# Patient Record
Sex: Male | Born: 1995 | Hispanic: No | Marital: Single | Smoking: Never smoker
Health system: Southern US, Community
[De-identification: ages and names within clinical notes are randomized; demographics above are authoritative.]

## PROBLEM LIST (undated history)

## (undated) DIAGNOSIS — J45909 Unspecified asthma, uncomplicated: Secondary | ICD-10-CM

## (undated) HISTORY — PX: NO PAST SURGERIES: SHX2092

---
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
## 2013-01-08 ENCOUNTER — Encounter: Payer: Self-pay | Admitting: Family Medicine

## 2013-01-08 ENCOUNTER — Ambulatory Visit (INDEPENDENT_AMBULATORY_CARE_PROVIDER_SITE_OTHER): Payer: Self-pay | Admitting: Family Medicine

## 2013-01-08 VITALS — BP 138/73 | HR 83 | Temp 99.4°F | Ht 73.5 in | Wt 298.0 lb

## 2013-01-08 DIAGNOSIS — J029 Acute pharyngitis, unspecified: Secondary | ICD-10-CM

## 2013-01-08 MED ORDER — BENZONATATE 100 MG PO CAPS: 100.0000 mg | ORAL_CAPSULE | Freq: Two times a day (BID) | ORAL | Status: AC | PRN

## 2013-01-08 MED ORDER — ACETAMINOPHEN-CODEINE #3 300-30 MG PO TABS: 1.0000 | ORAL_TABLET | ORAL | Status: AC | PRN

## 2013-01-08 MED ORDER — LIDOCAINE VISCOUS 2 % MT SOLN: 20.0000 mL | OROMUCOSAL | Status: DC | PRN

## 2013-01-08 NOTE — Patient Instructions (Signed)
Your sore throat is being caused by a virus. You can take either Tessalon or Tylenol with Codeine for your cough and viscous lidocaine for your sore throat.  Viral Pharyngitis Viral pharyngitis is a viral infection that produces redness, pain, and swelling (inflammation) of the throat. It can spread from person to person (contagious). CAUSES Viral pharyngitis is caused by inhaling a large amount of certain germs called viruses. Many different viruses cause viral pharyngitis. SYMPTOMS Symptoms of viral pharyngitis include:  Sore throat.  Tiredness.  Stuffy nose.  Low-grade fever.  Congestion.  Cough. TREATMENT Treatment includes rest, drinking plenty of fluids, and the use of over-the-counter medication (approved by your caregiver). HOME CARE INSTRUCTIONS   Drink enough fluids to keep your urine clear or pale yellow.  Eat soft, cold foods such as ice cream, frozen ice pops, or gelatin dessert.  Gargle with warm salt water (1 tsp salt per 1 qt of water).  If over age 33, throat lozenges may be used safely.  Only take over-the-counter or prescription medicines for pain, discomfort, or fever as directed by your caregiver. Do not take aspirin. To help prevent spreading viral pharyngitis to others, avoid:  Mouth-to-mouth contact with others.  Sharing utensils for eating and drinking.  Coughing around others. SEEK MEDICAL CARE IF:   You are better in a few days, then become worse.  You have a fever or pain not helped by pain medicines.  There are any other changes that concern you. Document Released: 06/09/2005 Document Revised: 11/22/2011 Document Reviewed: 11/05/2010 [facility] Patient Information 2013 [place], [place].

## 2013-01-08 NOTE — Progress Notes (Signed)
Patient ID: Shane Rivas, male   DOB: [birth year], 17 y.o.   MRN: [id number] Subjective: The patient is a 17 y.o. year old male who presents today for sore throat.  Patient reports a sore throat began 3 days ago. It is accompanied by rhinorrhea, mild cough, and general malaise. Patient reports that cough makes his throat hurt worse as does swallowing. Patient denies any nausea, vomiting, diarrhea, abdominal pain, fevers, chills, or shortness of breath.  Patient's past medical, social, and family history were reviewed and updated as appropriate. History  Substance Use Topics  . Smoking status: Never Smoker   . Smokeless tobacco: Not on file  . Alcohol Use: No   Objective:  Filed Vitals:   01/08/13 0926  BP: 138/73  Pulse: 83  Temp: 99.4 F (37.4 C)   Gen: No acute distress HEENT: Mucous members moist, extraocular movements intact, slight clear rhinorrhea. No pharyngeal erythema, no tonsillar exudates. No cervical adenopathy. CV: Regular rate and rhythm Resp: Clear to auscultation bilaterally  Assessment/Plan: Viral pharyngitis. No evidence of bacterial infection. Supportive treatment as outlined in patient instructions.  Please also see individual problems in problem list for problem-specific plans.

## 2013-02-08 ENCOUNTER — Ambulatory Visit (INDEPENDENT_AMBULATORY_CARE_PROVIDER_SITE_OTHER): Payer: Self-pay | Admitting: *Deleted

## 2013-02-08 ENCOUNTER — Ambulatory Visit: Payer: Medicaid Other | Admitting: Family Medicine

## 2013-02-08 DIAGNOSIS — Z23 Encounter for immunization: Secondary | ICD-10-CM

## 2013-02-08 NOTE — Progress Notes (Signed)
Pt here today with father for immunizations: Garasil (HPV). Consent obtained and VIS given. Pt tolerated well.. NO further questions or concerns noted. Wyatt Haste, RN-BSN

## 2013-02-20 ENCOUNTER — Telehealth: Payer: Self-pay | Admitting: Family Medicine

## 2013-02-20 NOTE — Telephone Encounter (Signed)
Father dropped off form to be filled out for college.  Please call him when completed.

## 2013-02-21 NOTE — Telephone Encounter (Signed)
Certificate of Immunizations completed and placed in Dr. Radonna Ricker box for signature.  Shane Rivas

## 2013-02-21 NOTE — Telephone Encounter (Signed)
done

## 2013-07-19 ENCOUNTER — Encounter: Payer: Self-pay | Admitting: Family Medicine

## 2013-07-19 ENCOUNTER — Ambulatory Visit (INDEPENDENT_AMBULATORY_CARE_PROVIDER_SITE_OTHER): Payer: Medicaid Other | Admitting: Family Medicine

## 2013-07-19 VITALS — BP 100/78 | HR 83 | Temp 98.6°F | Ht 73.5 in | Wt 338.0 lb

## 2013-07-19 DIAGNOSIS — J309 Allergic rhinitis, unspecified: Secondary | ICD-10-CM

## 2013-07-19 DIAGNOSIS — J069 Acute upper respiratory infection, unspecified: Secondary | ICD-10-CM

## 2013-07-19 DIAGNOSIS — J020 Streptococcal pharyngitis: Secondary | ICD-10-CM

## 2013-07-19 MED ORDER — FLUTICASONE PROPIONATE 50 MCG/ACT NA SUSP: 2.0000 | Freq: Every day | NASAL | Status: AC

## 2013-07-19 NOTE — Progress Notes (Signed)
Subjective:    Shane Rivas is a 17 y.o. male who presents to [facility] today for sore throat:  1.  Sore throat:  Started about 2 days ago.  Had some associated cough and rhinorrhea for same period of time. Felt subjectively febrile. Cough directive of yellow sputum. No chills. Eating and drinking well.  Subjective fever at home.  Had some "tightness" in his stomach, felt bloated, yesterday.  No nausea/vomiting.  No diarrhea.  Better today.      The following portions of the patient's history were reviewed and updated as appropriate: allergies, current medications, past medical history, family and social history, and problem list. Patient is a nonsmoker.    PMH reviewed.  No past medical history on file. Past Surgical History  Procedure Laterality Date  . No past surgeries      Medications reviewed. Current Outpatient Prescriptions  Medication Sig Dispense Refill  . acetaminophen-codeine (TYLENOL #3) 300-30 MG per tablet Take 1 tablet by mouth every 4 (four) hours as needed (cough). Do not drive while taking medication  20 tablet  0  . benzonatate (TESSALON) 100 MG capsule Take 1 capsule (100 mg total) by mouth 2 (two) times daily as needed for cough.  20 capsule  0  . lidocaine (XYLOCAINE) 2 % solution Take 20 mLs by mouth every 4 (four) hours as needed for pain.  100 mL  0   No current facility-administered medications for this visit.    ROS as above otherwise neg.  No chest pain, palpitations, SOB, Fever, Chills, Abd pain, N/V/D.   Objective:   Physical Exam BP 100/78  Pulse 83  Temp(Src) 98.6 F (37 C) (Oral)  Ht 6' 1.5" (1.867 m)  Wt 338 lb (153.316 kg)  BMI 43.98 kg/m2 Gen:  Patient sitting on exam table, appears stated age in no acute distress Head: Normocephalic atraumatic Eyes: EOMI, PERRL, sclera and conjunctiva non-erythematous Ears:  Canals clear bilaterally.  TMs pearly gray bilaterally without erythema or bulging.   Nose:  Nasal turbinates grossly enlarged  bilaterally. Boggy appearing  Mouth: Mucosa membranes moist. Tonsils +2, nonenlarged, non-erythematous. Oropharynx pink and moist. Nonerythematous appearing Neck: No cervical lymphadenopathy noted Heart:  RRR, no murmurs auscultated. Pulm:  Clear to auscultation bilaterally with good air movement.  No wheezes or rales noted.      No results found for this or any previous visit (from the past 72 hour(s)).

## 2013-07-19 NOTE — Patient Instructions (Signed)
Use the Flonase 2 sprays in each nose.   This should clear everything up for your.   It was good to meet you.

## 2013-07-23 DIAGNOSIS — J309 Allergic rhinitis, unspecified: Secondary | ICD-10-CM | POA: Insufficient documentation

## 2013-07-23 DIAGNOSIS — J069 Acute upper respiratory infection, unspecified: Secondary | ICD-10-CM | POA: Insufficient documentation

## 2013-07-23 NOTE — Assessment & Plan Note (Signed)
Likely viral illness based on symptoms and history.  No signs of bacterial illness. Symptomatic treatment for now, see instructions. Return if worsening or no improvement in 1 week.   

## 2013-07-23 NOTE — Assessment & Plan Note (Signed)
Seen on exam. Corroborated for further history Flonase to treat plus OTC anti-histamines.

## 2013-08-13 ENCOUNTER — Encounter: Payer: Self-pay | Admitting: Family Medicine

## 2014-02-24 IMAGING — CR DG CHEST 2V
2 series · 2 of 2 positions shown · non-contrast
Comparison: 06/18/2008

CLINICAL DATA: Shortness of breath, chest pain.

CHEST - 2 VIEW

[w chest pa]
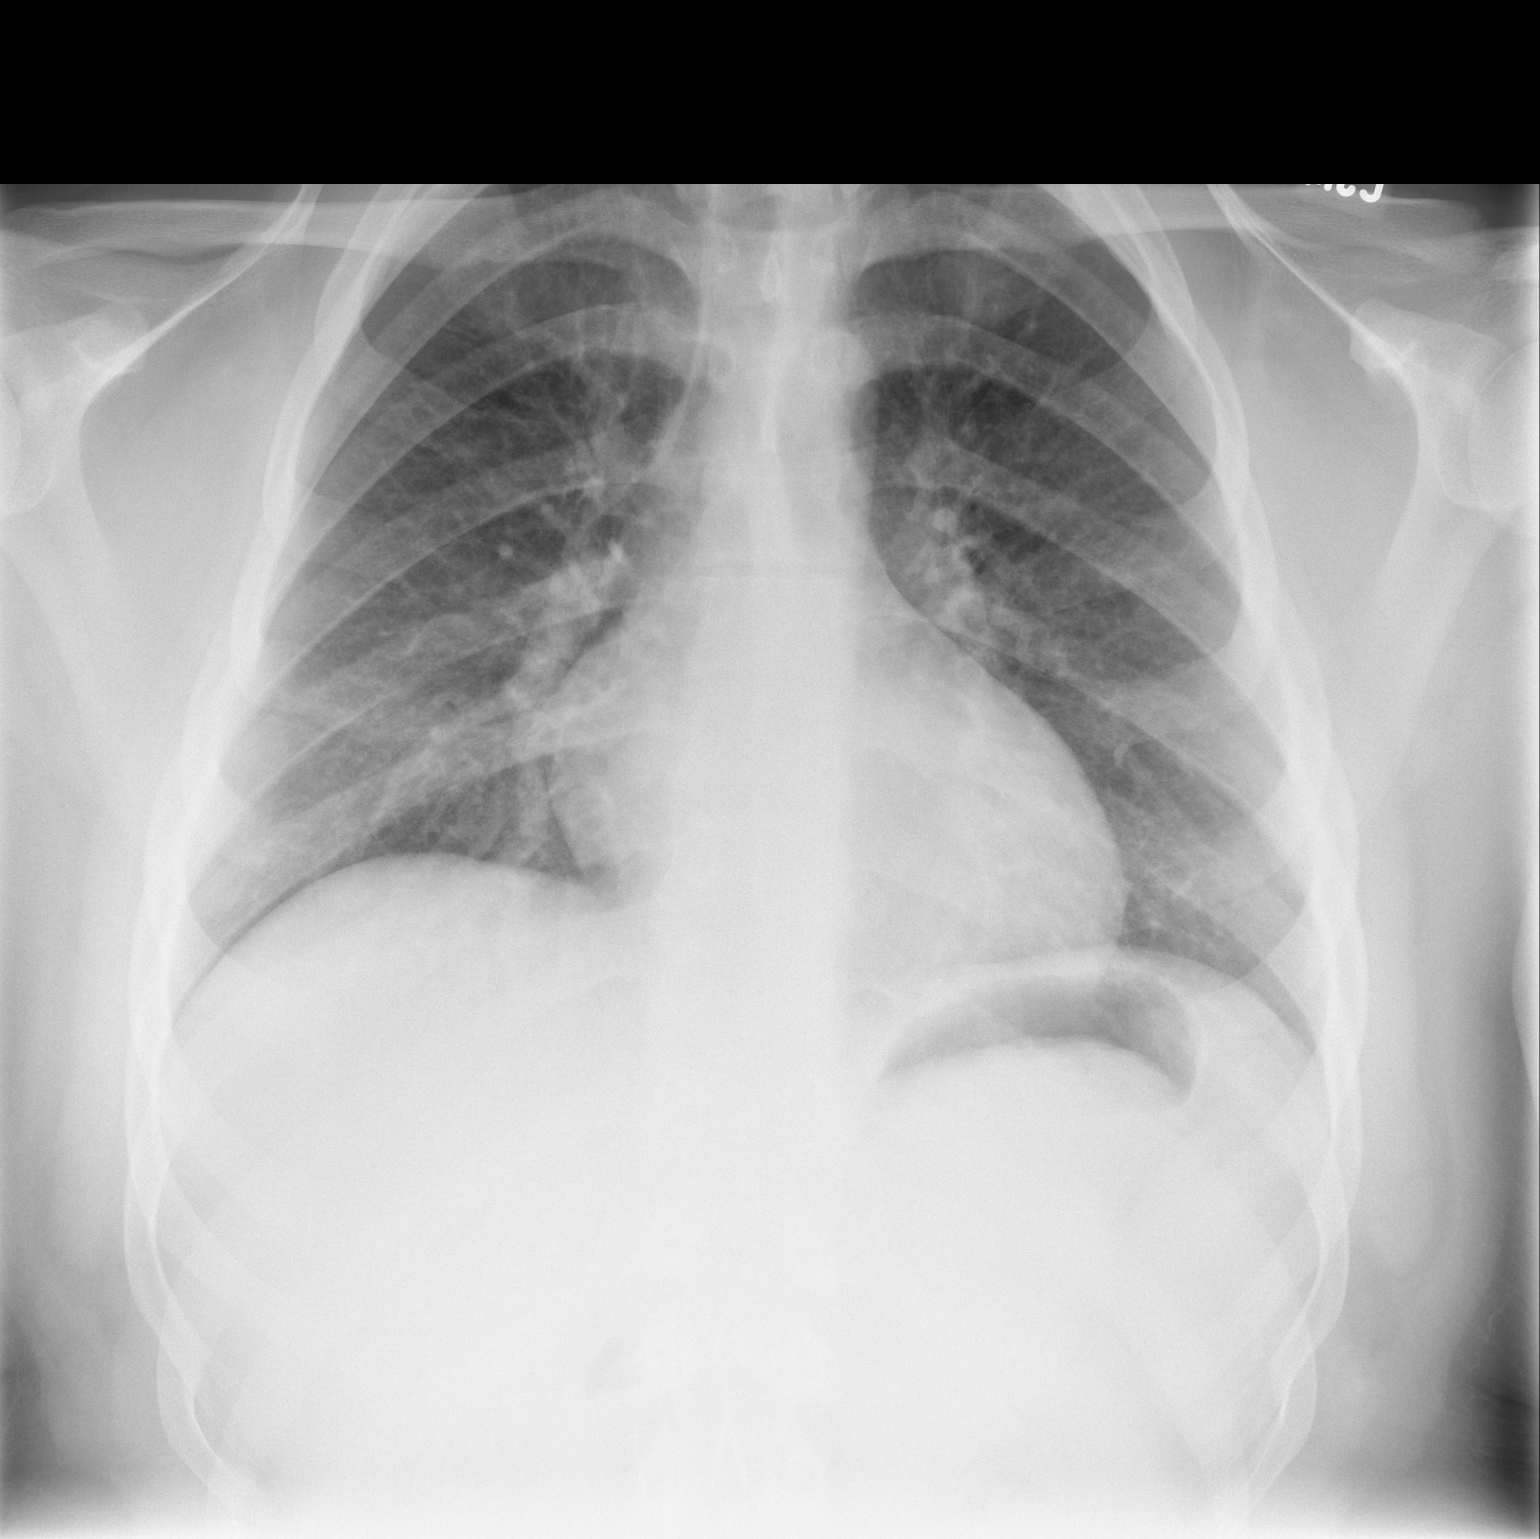

[w chest lat]
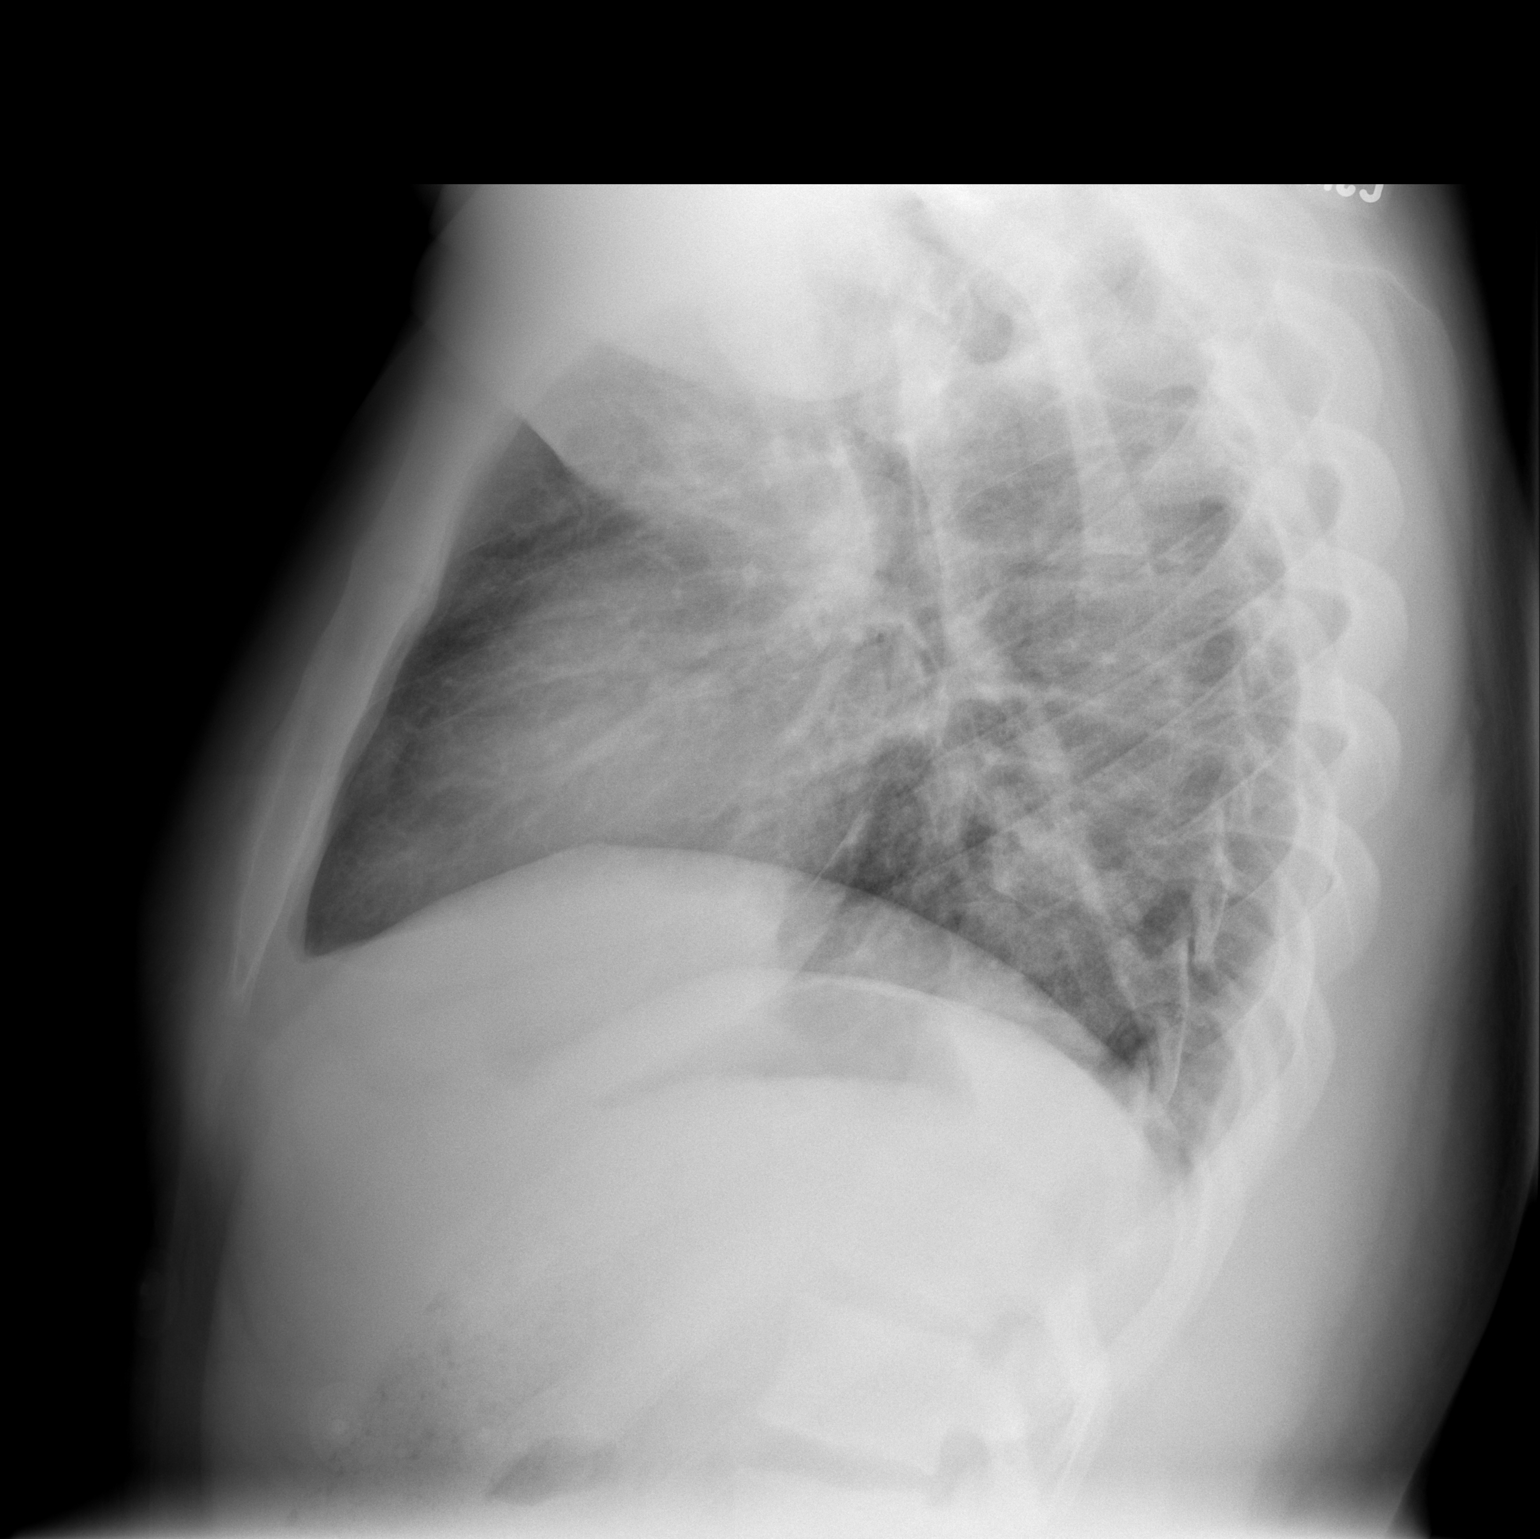

[2 of 2 positions shown; findings below may reference images not displayed]

FINDINGS: Mild peribronchial thickening. Minimal left lung base
opacities likely scarring or atelectasis.  Otherwise, no focal
consolidation.  No pleural effusion or pneumothorax.
Cardiomediastinal contours within normal range.  No acute osseous
finding.
IMPRESSION: Mild peribronchial thickening is nonspecific; may reflect
bronchitis, viral infection, or reactive airway disease.  No focal
consolidation.

## 2014-04-01 ENCOUNTER — Ambulatory Visit: Payer: Medicaid Other | Admitting: Family Medicine

## 2014-04-09 ENCOUNTER — Ambulatory Visit (INDEPENDENT_AMBULATORY_CARE_PROVIDER_SITE_OTHER): Payer: Medicaid Other | Admitting: Family Medicine

## 2014-04-09 ENCOUNTER — Encounter: Payer: Self-pay | Admitting: Family Medicine

## 2014-04-09 VITALS — BP 138/72 | HR 73 | Temp 98.4°F | Ht 74.75 in | Wt 361.5 lb

## 2014-04-09 DIAGNOSIS — Z00129 Encounter for routine child health examination without abnormal findings: Secondary | ICD-10-CM

## 2014-04-09 DIAGNOSIS — M25552 Pain in left hip: Secondary | ICD-10-CM

## 2014-04-09 DIAGNOSIS — M25551 Pain in right hip: Secondary | ICD-10-CM | POA: Insufficient documentation

## 2014-04-09 DIAGNOSIS — M25559 Pain in unspecified hip: Secondary | ICD-10-CM

## 2014-04-09 DIAGNOSIS — Z23 Encounter for immunization: Secondary | ICD-10-CM

## 2014-04-09 NOTE — Assessment & Plan Note (Addendum)
Patient counseled on weight management. - Dr. Gerilyn PilgrimSykes information given to him today, he seems to be interested in nutrition consult. - Patient advised to exercise 150 minutes a week - Discussed portion control, lower salt diet, lower cholesterol diet, in addition AVS was given on the food label reading and low salt diet. - Patient is also with borderline blood pressure readings for age. - We'll continue to follow in 1-3 months, depending on his position about weight control. Have ordered future labs for him to obtain lipid profile and A1c.

## 2014-04-09 NOTE — Progress Notes (Signed)
Patient ID: Shane Rivas Pecha, male   DOB: 11/15/1995, 18 y.o.   MRN: [id number] Shane Rivas Reek is a 18 y.o. male presenting for his annual checkup and pre-college physical.   Bilateral hip pain: Patient complains of bilateral hip pain over the lateral side. No radiation of pain when it does occur. He states sometimes it happens when he first gets up in the morning also pain on either side. He has no pain currently. He has had no current injury. A few years ago he suffered from a hip dislocation, per patient. Nothing seems to make it worse, walking on it seems to make it better.  Obesity: BMI 45. Patient states that he works at [employer] and eats two meals a day there. He does not exercise. He states since graduating he does not play sports. He admits to not being overly motivated.  Allergies: Review of patient's allergies indicates no known allergies.   Family history of hypertension in his father.  ROS: Feeling well. No dyspnea or chest pain on exertion. No abdominal pain, change in bowel habits, black or bloody stools. No urinary tract or prostatic symptoms. No neurological complaints.   OBJECTIVE:  BP 138/72  Pulse 73  Temp(Src) 98.4 F (36.9 C) (Oral)  Ht 6' 2.75" (1.899 m)  Wt 361 lb 8 oz (163.975 kg)  BMI 45.47 kg/m2 Gen: Pleasant, morbidly obese male. Well-developed, well-nourished, nontoxic in appearance, no acute distress. HEENT: AT. [place]. Bilateral TM visualized and normal in appearance. Bilateral eyes without injections or icterus. MMM. Bilateral nares without erythema or swelling. Throat without erythema or exudates.  CV: RRR, no murmurs clicks gallops or rubs Chest: CTAB, no wheeze or crackles Abd: Soft. Obese. NTND. BS present. No Masses palpated.  Ext: No erythema. No edema. No tenderness to palpation bilateral hips. Skin: No rashes, purpura or petechiae.  Neuro:  Normal gait. PERLA. EOMi. Alert. Grossly intact.   ASSESSMENT:  healthy adult male  Pre-hypertension  Vaccinations  up-to-date after today, second hepatitis A and third HPV given today. Bilateral muscle skeletal hip pain.  PLAN:  follow a low fat, low cholesterol diet, attempt to lose weight and reduce salt in diet and cooking  Exercise 150 minutes a week. Return to clinic in 2 months, for weight check and blood pressure check. A1c lipid profile ordered for future lab appointment Advised of borderline blood pressure and need for continued surveillance.  Hip pain likely musculoskeletal in nature. Difficult to assess without current pain. Advised patient to make an appointment to be seen if the pain returns.

## 2014-04-09 NOTE — Assessment & Plan Note (Addendum)
Hip pain appears muscle skeletal age or when it does occur. Possible lateral femoral cutaneous nerve irritation.  Patient advised to make an appointment when he experiences pain. Does not appear to be hip joint or SI related.

## 2014-04-09 NOTE — Patient Instructions (Signed)
Hypertension Hypertension is another name for high blood pressure. High blood pressure forces your heart to work harder to pump blood. A blood pressure reading has two numbers, which includes a higher number over a lower number (example: 110/72). HOME CARE   Have your blood pressure rechecked by your doctor.  Only take medicine as told by your doctor. Follow the directions carefully. The medicine does not work as well if you skip doses. Skipping doses also puts you at risk for problems.  Do not smoke.  Monitor your blood pressure at home as told by your doctor. GET HELP IF:  You think you are having a reaction to the medicine you are taking.  You have repeat headaches or feel dizzy.  You have puffiness (swelling) in your ankles.  You have trouble with your vision. GET HELP RIGHT AWAY IF:   You get a very bad headache and are confused.  You feel weak, numb, or faint.  You get chest or belly (abdominal) pain.  You throw up (vomit).  You cannot breathe very well. MAKE SURE YOU:   Understand these instructions.  Will watch your condition.  Will get help right away if you are not doing well or get worse. Document Released: 02/16/2008 Document Revised: 09/04/2013 Document Reviewed: 06/22/2013 [facility] Patient Information 2015 [place], [place]. This information is not intended to replace advice given to you by your health care provider. Make sure you discuss any questions you have with your health care provider.  Reading Food Labels Foods that are packaged or in containers have a Nutrition Facts panel on the side or back. This is commonly called the food label. The food label helps you make healthy food choices by providing information about serving size and the amount of calories and various nutrients in the food. You can check the food label to find out if the food contains high or low amounts of nutrients that you want to limit in your diet. You can also use the food label to see  if the food is a good source of the nutrients that you want to make sure are included in your diet. HOW DO I READ THE FOOD LABEL?  Begin by checking the serving size and number of servings in the container. All of the nutrition information listed on the food label is based on one serving. If you eat more than one serving, you must multiply the amounts (such as calories, grams of saturated fat, or milligrams of sodium) by the number of servings.  Check the calories. Choosing foods that are low in calories can help you manage your weight.  Look at the numbers in the % Daily Value column for each listed nutrient. This gives you an idea of how much of the daily recommended amount for that nutrient is provided in one serving of the food. A daily value of 5% or less is considered low. A daily value of 20% or more is considered high.  Check the amounts for the items you should limit in your diet. These include:  Total fat.  Saturated fat.  Trans fat.  Cholesterol.  Sodium.  Check the amounts for the items you should make sure you get enough of. These include:  Dietary fiber.  Vitamins A and C.  Calcium.  Iron. WHAT INFORMATION IS PROVIDED ON THE FOOD LABEL? Serving Information  Serving size.  The serving size is listed in cups or pieces. The nutrient amounts listed on the food label apply to this amount of the  food.  Servings per container or package.  This shows the number of servings you can expect to get from the container or package if you follow the suggested serving size. Amount Per Serving  Calories.  The number of calories in one serving of the food. This information is helpful in managing weight. Low-calorie foods contain 40 calories or less. High-calorie foods contain 400 or more calories.  Calories from fat.  The number of calories that come from fat in one serving. Percent Daily Value Percent Daily Value (shown on the label as % Daily Value) tells you what  percent of the daily value for each nutrient one serving provides. The daily value is the recommended amount of the nutrient that you should get each day. For example, if 15% is listed next to dietary fiber, it means that one serving of the food will give you 15% of the recommended amount of fiber that you should get in a day. The daily values are based on a 2,000-calorie-per-day diet. You may get more or less than 2,000 calories in your diet each day, but the % Daily Value gives you an idea of whether the food contains a high or low amount of the listed nutrient. A daily value of 5% or less is low. A daily value of 20% or more is high. Total Fat Total fat shows you the total amount of fat in one serving (listed in grams). Foods with high amounts of fat usually have higher calories and may lead to weight gain. Two of the fats that make up a portion of the total fat are included on the label:  Saturated fat.  This number is the amount of saturated fat in one serving (listed in grams). Saturated fat increases the amount of blood cholesterol and should be limited to less than 7% of total calories each day. This means that if you eat 2,000 calories each day, you should eat less than 140 calories from saturated fat.   Trans fat.  This number is the amount of trans fat in one serving (listed in grams). Trans fat is the most unhealthy fat for heart health and should be limited to as little as possible (less than 2 grams per day). Cholesterol The amount of cholesterol in one serving is listed in milligrams. Cholesterol should be limited to no more than 300 mg each day. Sodium The amount of sodium in one serving is listed in milligrams. Most people should limit their sodium intake to 2,300 mg a day. Total Carbohydrate This number shows the amount of total carbohydrate in one serving (listed in grams). This information can help people with diabetes manage the amount of carbohydrate they eat. Two of the  carbohydrates that make up a portion of the total carbohydrate are included on the label:  Dietary fiber.  The amount of dietary fiber in one serving is listed in grams. Most people should eat at least 25 g of dietary fiber each day.   Sugars.  The amount of sugar in one serving is also listed in grams. This value includes both naturally occurring sugars from fruit and milk and added sugars such as honey or table sugar. Protein The amount of protein in one serving is listed in grams.  Vitamins and Minerals The % Daily Value is listed for vitamin A, vitamin C, calcium, and iron. Some food labels also list % Daily Values for some other vitamins or minerals.  WHAT OTHER IMPORTANT LABELING IS ON THE FOOD PACKAGE? Ingredients  Food labels will list each ingredient in the food. The first ingredient listed is the ingredient that the food has the most of. The ingredients are listed in the order of their amount by weight from highest to lowest. Food Allergen Labeling Food labels may also include a food allergen warning. Listed here are ingredients that can cause allergic reactions in some people. The potential allergens are listed behind the word "Contains" or "May contain." Examples of ingredients that may be listed are wheat, dairy, eggs, soy, and nuts. If a person knows that he or she is allergic to one of these ingredients, he or she will know to avoid that food. WHERE CAN I FIND MORE INFORMATION? U.S. Food and Drug Administration: [employer]kinSearch.com.eewww.fda.gov Document Released: 08/30/2005 Document Revised: 09/04/2013 Document Reviewed: 07/23/2013 [facility] Patient Information 2015 [place]. This information is not intended to replace advice given to you by your health care provider. Make sure you discuss any questions you have with your health care provider. Low-Sodium Eating Plan Sodium raises blood pressure and causes water to be held in the body. Getting less sodium from food will help lower your blood  pressure, reduce any swelling, and protect your heart, liver, and kidneys. We get sodium by adding salt (sodium chloride) to food. Most of our sodium comes from canned, boxed, and frozen foods. Restaurant foods, fast foods, and pizza are also very high in sodium. Even if you take medicine to lower your blood pressure or to reduce fluid in your body, getting less sodium from your food is important. WHAT IS MY PLAN? Most people should limit their sodium intake to 2,300 mg a day. Your health care provider recommends that you limit your sodium intake to __________ a day.  WHAT DO I NEED TO KNOW ABOUT THIS EATING PLAN? For the low-sodium eating plan, you will follow these general guidelines:  Choose foods with a % Daily Value for sodium of less than 5% (as listed on the food label).   Use salt-free seasonings or herbs instead of table salt or sea salt.   Check with your health care provider or pharmacist before using salt substitutes.   Eat fresh foods.  Eat more vegetables and fruits.  Limit canned vegetables. If you do use them, rinse them well to decrease the sodium.   Limit cheese to 1 oz (28 g) per day.   Eat lower-sodium products, often labeled as "lower sodium" or "no salt added."  Avoid foods that contain monosodium glutamate (MSG). MSG is sometimes added to [place] food and some canned foods.  Check food labels (Nutrition Facts labels) on foods to learn how much sodium is in one serving.  Eat more home-cooked food and less restaurant, buffet, and fast food.  When eating at a restaurant, ask that your food be prepared with less salt or none, if possible.  HOW DO I READ FOOD LABELS FOR SODIUM INFORMATION? The Nutrition Facts label lists the amount of sodium in one serving of the food. If you eat more than one serving, you must multiply the listed amount of sodium by the number of servings. Food labels may also identify foods as:  Sodium free--Less than 5 mg in a  serving.  Very low sodium--35 mg or less in a serving.  Low sodium--140 mg or less in a serving.  Light in sodium--50% less sodium in a serving. For example, if a food that usually has 300 mg of sodium is changed to become light in sodium, it will have 150 mg  of sodium.  Reduced sodium--25% less sodium in a serving. For example, if a food that usually has 400 mg of sodium is changed to reduced sodium, it will have 300 mg of sodium. WHAT FOODS CAN I EAT? Grains Low-sodium cereals, including oats, puffed wheat and rice, and shredded wheat cereals. Low-sodium crackers. Unsalted rice and pasta. Lower-sodium bread.  Vegetables Frozen or fresh vegetables. Low-sodium or reduced-sodium canned vegetables. Low-sodium or reduced-sodium tomato sauce and paste. Low-sodium or reduced-sodium tomato and vegetable juices.  Fruits Fresh, frozen, and canned fruit. Fruit juice.  Meat and Other Protein Products Low-sodium canned tuna and salmon. Fresh or frozen meat, poultry, seafood, and fish. Lamb. Unsalted nuts. Dried beans, peas, and lentils without added salt. Unsalted canned beans. Homemade soups without salt. Eggs.  Dairy Milk. Soy milk. Ricotta cheese. Low-sodium or reduced-sodium cheeses. Yogurt.  Condiments Fresh and dried herbs and spices. Salt-free seasonings. Onion and garlic powders. Low-sodium varieties of mustard and ketchup. Lemon juice.  Fats and Oils Reduced-sodium salad dressings. Unsalted butter.  Other Unsalted popcorn and pretzels.  The items listed above may not be a complete list of recommended foods or beverages. Contact your dietitian for more options. WHAT FOODS ARE NOT RECOMMENDED? Grains Instant hot cereals. Bread stuffing, pancake, and biscuit mixes. Croutons. Seasoned rice or pasta mixes. Noodle soup cups. Boxed or frozen macaroni and cheese. Self-rising flour. Regular salted crackers. Vegetables Regular canned vegetables. Regular canned tomato sauce and paste.  Regular tomato and vegetable juices. Frozen vegetables in sauces. Salted french fries. Olives. Rosita Fire. Relishes. Sauerkraut. Salsa. Meat and Other Protein Products Salted, canned, smoked, spiced, or pickled meats, seafood, or fish. Bacon, ham, sausage, hot dogs, corned beef, chipped beef, and packaged luncheon meats. Salt pork. Jerky. Pickled herring. Anchovies, regular canned tuna, and sardines. Salted nuts. Dairy Processed cheese and cheese spreads. Cheese curds. Blue cheese and cottage cheese. Buttermilk.  Condiments Onion and garlic salt, seasoned salt, table salt, and sea salt. Canned and packaged gravies. Worcestershire sauce. Tartar sauce. Barbecue sauce. Teriyaki sauce. Soy sauce, including reduced sodium. Steak sauce. Fish sauce. Oyster sauce. Cocktail sauce. Horseradish. Regular ketchup and mustard. Meat flavorings and tenderizers. Bouillon cubes. Hot sauce. Tabasco sauce. Marinades. Taco seasonings. Relishes. Fats and Oils Regular salad dressings. Salted butter. Margarine. Ghee. Bacon fat.  Other Potato and tortilla chips. Corn chips and puffs. Salted popcorn and pretzels. Canned or dried soups. Pizza. Frozen entrees and pot pies.  The items listed above may not be a complete list of foods and beverages to avoid. Contact your dietitian for more information. Document Released: 02/19/2002 Document Revised: 09/04/2013 Document Reviewed: 07/04/2013 [facility] Patient Information 2015 [place], [place]. This information is not intended to replace advice given to you by your health care provider. Make sure you discuss any questions you have with your health care provider.  It was a pleasure meeting you today.  I have included information for you about label reading on foods and low salt diet.  Please call Dr. Gerilyn Pilgrim (Nutrition) to make appointment as soon as possible. In addition make an appointment with me, to discuss in more detail within 1 -2 months.  I will ant to follow your BP in 3-6  months after you have started your diet/exercise program. Attempt to get 150 minutes of exercise a week.  Good luck with College!

## 2014-04-10 NOTE — Addendum Note (Signed)
Addended by: Farrell OursEVANS, Leeum Sankey K on: 04/10/2014 01:53 PM   Modules accepted: Orders

## 2015-09-16 ENCOUNTER — Emergency Department (HOSPITAL_COMMUNITY)
Admission: EM | Admit: 2015-09-16 | Discharge: 2015-09-16 | Disposition: A | Discharge status: Home / Self Care | Payer: Medicaid Other | Attending: Emergency Medicine | Admitting: Emergency Medicine

## 2015-09-16 ENCOUNTER — Encounter (HOSPITAL_COMMUNITY): Payer: Self-pay | Admitting: Emergency Medicine

## 2015-09-16 DIAGNOSIS — Z792 Long term (current) use of antibiotics: Secondary | ICD-10-CM | POA: Insufficient documentation

## 2015-09-16 DIAGNOSIS — J45909 Unspecified asthma, uncomplicated: Secondary | ICD-10-CM | POA: Insufficient documentation

## 2015-09-16 DIAGNOSIS — Z7951 Long term (current) use of inhaled steroids: Secondary | ICD-10-CM | POA: Insufficient documentation

## 2015-09-16 DIAGNOSIS — K0889 Other specified disorders of teeth and supporting structures: Secondary | ICD-10-CM | POA: Insufficient documentation

## 2015-09-16 DIAGNOSIS — G478 Other sleep disorders: Secondary | ICD-10-CM | POA: Insufficient documentation

## 2015-09-16 HISTORY — DX: Unspecified asthma, uncomplicated: J45.909

## 2015-09-16 MED ORDER — PENICILLIN V POTASSIUM 250 MG PO TABS
250.0000 mg | ORAL_TABLET | Freq: Once | ORAL | Status: AC
  Administered 2015-09-16: 250 mg via ORAL
  Filled 2015-09-16: qty 1

## 2015-09-16 MED ORDER — PENICILLIN V POTASSIUM 250 MG PO TABS: 250.0000 mg | ORAL_TABLET | Freq: Four times a day (QID) | ORAL | Status: AC

## 2015-09-16 NOTE — ED Notes (Signed)
Pt. reports worsening right lower molar pain with swelling onset last week unrelieved by OTC pain medications .

## 2015-09-16 NOTE — ED Provider Notes (Signed)
CSN: [id number]     Arrival date & time 09/16/15  0019 History   First MD Initiated Contact with Patient 09/16/15 0029     Chief Complaint  Patient presents with  . Dental Pain     (Consider location/radiation/quality/duration/timing/severity/associated sxs/prior Treatment) HPI Comments: Patient presents to the emergency department with a dental complaint. Symptoms began 1 week ago. The patient has tried to alleviate pain with ibuprofen.  Pain rated at a 10/10, characterized as throbbing in nature and located right lower rear molar. Patient denies fever, night sweats, chills, difficulty swallowing or opening mouth, SOB, nuchal rigidity or decreased ROM of neck.  Patient does not have a dentist and requests a resource guide at discharge.   The history is provided by the patient. No language interpreter was used.    Past Medical History  Diagnosis Date  . Asthma    Past Surgical History  Procedure Laterality Date  . No past surgeries     Family History  Problem Relation Age of Onset  . Diabetes Father   . Hypertension Father    Social History  Substance Use Topics  . Smoking status: Never Smoker   . Smokeless tobacco: None  . Alcohol Use: No    Review of Systems  Constitutional: Negative for fever and chills.  HENT: Positive for dental problem. Negative for drooling.   Neurological: Negative for speech difficulty.  Psychiatric/Behavioral: Positive for sleep disturbance.      Allergies  Review of patient's allergies indicates no known allergies.  Home Medications   Prior to Admission medications   Medication Sig Start Date End Date Taking? Authorizing Provider  acetaminophen-codeine (TYLENOL #3) 300-30 MG per tablet Take 1 tablet by mouth every 4 (four) hours as needed (cough). Do not drive while taking medication 01/08/13   Brent BullaErik D Ritch, MD  benzonatate (TESSALON) 100 MG capsule Take 1 capsule (100 mg total) by mouth 2 (two) times daily as needed for cough. 01/08/13    Brent BullaErik D Ritch, MD  fluticasone (FLONASE) 50 MCG/ACT nasal spray Place 2 sprays into both nostrils daily. 07/19/13   Tobey GrimJeffrey H Walden, MD  penicillin v potassium (VEETID) 250 MG tablet Take 1 tablet (250 mg total) by mouth 4 (four) times daily. 09/16/15 09/23/15  Roxy Horsemanobert Sri Clegg, PA-C   BP 158/83 mmHg  Pulse 91  Temp(Src) 98.8 F (37.1 C) (Oral)  Resp 20  Ht 6\' 3"  (1.905 m)  Wt 173.529 kg  BMI 47.82 kg/m2  SpO2 97% Physical Exam  Constitutional: He is oriented to person, place, and time. He appears well-developed and well-nourished.  HENT:  Head: Normocephalic and atraumatic.  Mouth/Throat:    Poor dentition throughout.  Affected tooth as diagrammed.  No signs of peritonsillar or tonsillar abscess.  No signs of gingival abscess. Oropharynx is clear and without exudates.  Uvula is midline.  Airway is intact. No signs of Ludwig's angina with palpation of oral and sublingual mucosa.   Eyes: Conjunctivae and EOM are normal.  Neck: Normal range of motion.  Cardiovascular: Normal rate.   Pulmonary/Chest: Effort normal.  Abdominal: He exhibits no distension.  Musculoskeletal: Normal range of motion.  Neurological: He is alert and oriented to person, place, and time.  Skin: Skin is dry.  Psychiatric: He has a normal mood and affect. His behavior is normal. Judgment and thought content normal.  Nursing note and vitals reviewed.   ED Course  Procedures (including critical care time)   MDM   Final diagnoses:  Pain, dental  Patient with toothache.  No gross abscess.  Exam unconcerning for Ludwig's angina or spread of infection.  Will treat with penicillin and OTC pain medicine.  Urged patient to follow-up with dentist.       Roxy Horseman, PA-C 09/16/15 7425  Donnetta Hutching, MD 09/19/15 1452

## 2015-09-16 NOTE — Discharge Instructions (Signed)

## 2016-12-02 ENCOUNTER — Emergency Department (HOSPITAL_COMMUNITY)
Admission: EM | Admit: 2016-12-02 | Discharge: 2016-12-02 | Disposition: A | Discharge status: Home / Self Care | Payer: 59 | Attending: Emergency Medicine | Admitting: Emergency Medicine

## 2016-12-02 ENCOUNTER — Encounter (HOSPITAL_COMMUNITY): Payer: Self-pay

## 2016-12-02 DIAGNOSIS — J45909 Unspecified asthma, uncomplicated: Secondary | ICD-10-CM | POA: Diagnosis not present

## 2016-12-02 DIAGNOSIS — Z79899 Other long term (current) drug therapy: Secondary | ICD-10-CM | POA: Insufficient documentation

## 2016-12-02 DIAGNOSIS — B9789 Other viral agents as the cause of diseases classified elsewhere: Secondary | ICD-10-CM

## 2016-12-02 DIAGNOSIS — R05 Cough: Secondary | ICD-10-CM | POA: Diagnosis present

## 2016-12-02 DIAGNOSIS — J069 Acute upper respiratory infection, unspecified: Secondary | ICD-10-CM

## 2016-12-02 NOTE — ED Triage Notes (Signed)
Patient complains of 4 days of cough, congestion, nasal drainage and frontal headache. Denies fever. Alert and oriented, NAD

## 2016-12-02 NOTE — ED Provider Notes (Signed)
[facility]-EMERGENCY DEPT Provider Note   CSN: [id number] Arrival date & time: 12/02/16  1405   By signing my name below, I, Soijett Blue, attest that this documentation has been prepared under the direction and in the presence of Rolan Bucco, MD. Electronically Signed: Soijett Blue, ED Scribe. 12/02/16. 2:31 PM.  History   Chief Complaint Chief Complaint  Patient presents with  . congestion, cough    HPI Shane Rivas is a 21 y.o. male with a PMHx of asthma, who presents to the Emergency Department complaining of nasal congestion onset 4 days ago. Pt reports associated rhinorrhea, HA, fatigue, subjective fever, and diarrhea. Pt has tried dayquil and mucinex with mild relief of his symptoms. Denies vomiting, abdominal pain, SOB, leg swelling, and any other symptoms.   The history is provided by the patient. No language interpreter was used.    Past Medical History:  Diagnosis Date  . Asthma     Patient Active Problem List   Diagnosis Date Noted  . Severe obesity (BMI >= 40) (HCC) 04/09/2014  . Bilateral hip pain 04/09/2014  . Viral URI 07/23/2013  . Allergic rhinitis 07/23/2013    Past Surgical History:  Procedure Laterality Date  . NO PAST SURGERIES         Home Medications    Prior to Admission medications   Medication Sig Start Date End Date Taking? Authorizing Provider  acetaminophen-codeine (TYLENOL #3) 300-30 MG per tablet Take 1 tablet by mouth every 4 (four) hours as needed (cough). Do not drive while taking medication 01/08/13   Brent Bulla, MD  benzonatate (TESSALON) 100 MG capsule Take 1 capsule (100 mg total) by mouth 2 (two) times daily as needed for cough. 01/08/13   Brent Bulla, MD  fluticasone (FLONASE) 50 MCG/ACT nasal spray Place 2 sprays into both nostrils daily. 07/19/13   Tobey Grim, MD    Family History Family History  Problem Relation Age of Onset  . Diabetes Father   . Hypertension Father     Social History Social History    Substance Use Topics  . Smoking status: Never Smoker  . Smokeless tobacco: Never Used  . Alcohol use No     Allergies   Patient has no known allergies.   Review of Systems Review of Systems  Constitutional: Positive for fever (subjective).  HENT: Positive for congestion and rhinorrhea.   Respiratory: Positive for cough. Negative for shortness of breath.   Cardiovascular: Negative for leg swelling.  Gastrointestinal: Positive for diarrhea. Negative for abdominal pain and vomiting.  All other systems reviewed and are negative.    Physical Exam Updated Vital Signs BP (!) 145/98 (BP Location: Right Arm)   Pulse 86   Temp 99.6 F (37.6 C) (Oral)   Resp 17   Ht 6\' 3"  (1.905 m)   Wt (!) 380 lb (172.4 kg)   SpO2 98%   BMI 47.50 kg/m   Physical Exam  Constitutional: He is oriented to person, place, and time. He appears well-developed and well-nourished.  HENT:  Head: Normocephalic and atraumatic.  Right Ear: Tympanic membrane normal.  Left Ear: Tympanic membrane normal.  Mouth/Throat: Uvula is midline, oropharynx is clear and moist and mucous membranes are normal.  TM's clear  Eyes: Pupils are equal, round, and reactive to light.  Neck: Normal range of motion. Neck supple.  Cardiovascular: Normal rate, regular rhythm and normal heart sounds.   Pulmonary/Chest: Effort normal and breath sounds normal. No respiratory distress. He has no wheezes. He  has no rales. He exhibits no tenderness.  Abdominal: Soft. Bowel sounds are normal. There is no tenderness. There is no rebound and no guarding.  Musculoskeletal: Normal range of motion. He exhibits no edema.  Lymphadenopathy:    He has no cervical adenopathy.  Neurological: He is alert and oriented to person, place, and time.  Skin: Skin is warm and dry. No rash noted.  Psychiatric: He has a normal mood and affect.     ED Treatments / Results  DIAGNOSTIC STUDIES: Oxygen Saturation is 96% on RA, nl by my interpretation.     COORDINATION OF CARE 2:30 PM Discussed treatment plan with pt at bedside and pt agreed to plan.   Procedures Procedures (including critical care time)  Medications Ordered in ED Medications - No data to display   Initial Impression / Assessment and Plan / ED Course  I have reviewed the triage vital signs and the nursing notes.   Patient presents with viral URI symptoms. He has no shortness of breath. He has no hypoxia. No suggestions of pneumonia. He's otherwise well-appearing without ongoing vomiting. He was discharged home in good condition. Symptomatic care instructions were given. Return precautions were given. His blood pressure is a little bit elevated and he was advised he needs to have this rechecked by PCP.  Final Clinical Impressions(s) / ED Diagnoses   Final diagnoses:  Viral URI with cough    New Prescriptions New Prescriptions   No medications on file   I personally performed the services described in this documentation, which was scribed in my presence.  The recorded information has been reviewed and considered.     Rolan BuccoMelanie Davontay Watlington, MD 12/02/16 1444

## 2017-09-08 ENCOUNTER — Emergency Department (HOSPITAL_COMMUNITY)
Admission: EM | Admit: 2017-09-08 | Discharge: 2017-09-08 | Disposition: A | Discharge status: Home / Self Care | Payer: 59 | Attending: Emergency Medicine | Admitting: Emergency Medicine

## 2017-09-08 ENCOUNTER — Encounter (HOSPITAL_COMMUNITY): Payer: Self-pay

## 2017-09-08 DIAGNOSIS — J069 Acute upper respiratory infection, unspecified: Secondary | ICD-10-CM | POA: Insufficient documentation

## 2017-09-08 DIAGNOSIS — B9789 Other viral agents as the cause of diseases classified elsewhere: Secondary | ICD-10-CM

## 2017-09-08 DIAGNOSIS — J45909 Unspecified asthma, uncomplicated: Secondary | ICD-10-CM | POA: Insufficient documentation

## 2017-09-08 DIAGNOSIS — Z79899 Other long term (current) drug therapy: Secondary | ICD-10-CM | POA: Insufficient documentation

## 2017-09-08 DIAGNOSIS — R197 Diarrhea, unspecified: Secondary | ICD-10-CM | POA: Insufficient documentation

## 2017-09-08 DIAGNOSIS — J988 Other specified respiratory disorders: Secondary | ICD-10-CM

## 2017-09-08 LAB — CBC
HCT: 43.1 % (ref 39.0–52.0)
Hemoglobin: 14.1 g/dL (ref 13.0–17.0)
MCH: 27.3 pg (ref 26.0–34.0)
MCHC: 32.7 g/dL (ref 30.0–36.0)
MCV: 83.5 fL (ref 78.0–100.0)
Platelets: 245 10*3/uL (ref 150–400)
RBC: 5.16 MIL/uL (ref 4.22–5.81)
RDW: 13.6 % (ref 11.5–15.5)
WBC: 12 10*3/uL — AB (ref 4.0–10.5)

## 2017-09-08 LAB — COMPREHENSIVE METABOLIC PANEL
ALK PHOS: 88 U/L (ref 38–126)
ALT: 60 U/L (ref 17–63)
AST: 37 U/L (ref 15–41)
Albumin: 3.7 g/dL (ref 3.5–5.0)
Anion gap: 9 (ref 5–15)
BILIRUBIN TOTAL: 0.4 mg/dL (ref 0.3–1.2)
BUN: 9 mg/dL (ref 6–20)
CALCIUM: 8.9 mg/dL (ref 8.9–10.3)
CHLORIDE: 102 mmol/L (ref 101–111)
CO2: 26 mmol/L (ref 22–32)
CREATININE: 0.73 mg/dL (ref 0.61–1.24)
GFR calc Af Amer: >60 mL/min (ref >60)
Glucose, Bld: 148 mg/dL — ABNORMAL HIGH (ref 65–99)
Potassium: 3.6 mmol/L (ref 3.5–5.1)
Sodium: 137 mmol/L (ref 135–145)
TOTAL PROTEIN: 7.7 g/dL (ref 6.5–8.1)

## 2017-09-08 NOTE — ED Provider Notes (Signed)
[facility] EMERGENCY [facility]PARTMENT Provider Note   CSN: [id number] Arrival date & time: 09/08/17  [id number]     History   Chief Complaint Chief Complaint  Patient presents with  . Influenza  . Diarrhea  . Emesis    HPI Shane Rivas is a 21 y.o. male.  Complains of cough sore throat, onset 8 days ago associated symptoms include nasal congestion.  Patient had 3 episodes of diarrhea this morning.  He has had no diarrhea prior to today he denies any shortness of breath he admits to slight anterior chest pain with cough.  Treated with Mucinex Motrin and ibuprofen with partial relief.  He denies nausea denies other associated symptoms.  Nothing makes symptoms better or worse.  His father has had the same illness which also started 8 days ago, father feels improved.  HPI  Past Medical History:  Diagnosis Date  . Asthma   Asthma as child Patient Active Problem List   Diagnosis Date Noted  . Severe obesity (BMI >= 40) (HCC) 04/09/2014  . Bilateral hip pain 04/09/2014  . Viral URI 07/23/2013  . Allergic rhinitis 07/23/2013    Past Surgical History:  Procedure Laterality Date  . NO PAST SURGERIES         Home Medications    Prior to Admission medications   Medication Sig Start Date End Date Taking? Authorizing Provider  acetaminophen-codeine (TYLENOL #3) 300-30 MG per tablet Take 1 tablet by mouth every 4 (four) hours as needed (cough). Do not drive while taking medication 01/08/13   Brent Bullaitch, Erik D, MD  benzonatate (TESSALON) 100 MG capsule Take 1 capsule (100 mg total) by mouth 2 (two) times daily as needed for cough. 01/08/13   Brent Bullaitch, Erik D, MD  fluticasone (FLONASE) 50 MCG/ACT nasal spray Place 2 sprays into both nostrils daily. 07/19/13   Tobey GrimWalden, Jeffrey H, MD    Family History Family History  Problem Relation Age of Onset  . Diabetes Father   . Hypertension Father     Social History Social History   Tobacco Use  . Smoking status: Never Smoker  .  Smokeless tobacco: Never Used  Substance Use Topics  . Alcohol use: No  . Drug use: No     Allergies   Patient has no known allergies.   Review of Systems Review of Systems  Constitutional: Negative.   HENT: Positive for congestion and sore throat.   Respiratory: Positive for cough.   Cardiovascular: Negative.   Gastrointestinal: Positive for diarrhea.  Musculoskeletal: Negative.   Skin: Negative.   Neurological: Negative.   Psychiatric/Behavioral: Negative.   All other systems reviewed and are negative.    Physical Exam Updated Vital Signs BP (!) 149/88   Pulse 83   Temp 98.1 F (36.7 C) (Oral)   Resp 18   SpO2 99%   Physical Exam  Constitutional: He appears well-developed and well-nourished.  HENT:  Head: Normocephalic and atraumatic.  Right Ear: External ear normal.  Left Ear: External ear normal.  Oropharynx reddened.  No tonsillar exudate.  Uvula midline.  Bilateral tympanic membranes mildly reddened positive nasal congestion  Eyes: Conjunctivae are normal. Pupils are equal, round, and reactive to light.  Neck: Neck supple. No tracheal deviation present. No thyromegaly present.  Cardiovascular: Normal rate and regular rhythm.  No murmur heard. Pulmonary/Chest: Effort normal and breath sounds normal.  Abdominal: Soft. Bowel sounds are normal. He exhibits no distension. There is no tenderness.  Obese  Musculoskeletal: Normal range of motion. He  exhibits no edema or tenderness.  Lymphadenopathy:    He has no cervical adenopathy.  Neurological: He is alert. Coordination normal.  Skin: Skin is warm and dry. Capillary refill takes less than 2 seconds. No rash noted.  Psychiatric: He has a normal mood and affect.  Nursing note and vitals reviewed.    ED Treatments / Results  Labs (all labs ordered are listed, but only abnormal results are displayed) Labs Reviewed  COMPREHENSIVE METABOLIC PANEL  CBC   Results for orders placed or performed during the  hospital encounter of 09/08/17  Comprehensive metabolic panel  Result Value Ref Range   Sodium 137 135 - 145 mmol/L   Potassium 3.6 3.5 - 5.1 mmol/L   Chloride 102 101 - 111 mmol/L   CO2 26 22 - 32 mmol/L   Glucose, Bld 148 (H) 65 - 99 mg/dL   BUN 9 6 - 20 mg/dL   Creatinine, Ser 4.780.73 0.61 - 1.24 mg/dL   Calcium 8.9 8.9 - 29.510.3 mg/dL   Total Protein 7.7 6.5 - 8.1 g/dL   Albumin 3.7 3.5 - 5.0 g/dL   AST 37 15 - 41 U/L   ALT 60 17 - 63 U/L   Alkaline Phosphatase 88 38 - 126 U/L   Total Bilirubin 0.4 0.3 - 1.2 mg/dL   GFR calc non Af Amer >60 >60 mL/min   GFR calc Af Amer >60 >60 mL/min   Anion gap 9 5 - 15  CBC  Result Value Ref Range   WBC 12.0 (H) 4.0 - 10.5 K/uL   RBC 5.16 4.22 - 5.81 MIL/uL   Hemoglobin 14.1 13.0 - 17.0 g/dL   HCT 62.143.1 30.839.0 - 65.752.0 %   MCV 83.5 78.0 - 100.0 fL   MCH 27.3 26.0 - 34.0 pg   MCHC 32.7 30.0 - 36.0 g/dL   RDW 84.613.6 96.211.5 - 95.215.5 %   Platelets 245 150 - 400 K/uL   No results found. EKG  EKG Interpretation None      . Radiology No results found.  Procedures Procedures (including critical care time)  Medications Ordered in ED Medications - No data to display   Initial Impression / Assessment and Plan / ED Course  I have reviewed the triage vital signs and the nursing notes.  Pertinent labs & imaging results that were available during my care of the patient were reviewed by me and considered in my medical decision making (see chart for details).     History and exam consistent with viral illness.  Suggest oral hydration.    Tylenol, Mucinex, referral primary care.  Blood pressure recheck 3 weeks.  Final Clinical Impressions(s) / ED Diagnoses  Diagnosis viral respiratory illness Final diagnoses:  None    ED Discharge Orders    None       Doug SouJacubowitz, Derick [place], MD 09/08/17 [phone]

## 2017-09-08 NOTE — ED Triage Notes (Signed)
Pt states for the past weak he has felt sick and has had episodes of emesis and diarrhea with severe cough; pt has hx of asthma; pt c/o sore throat 6/10 on arrival. Pt states his dad has the same sx; Dad is currently in room with patient;-Monique,RN

## 2017-09-08 NOTE — Discharge Instructions (Signed)
Take Tylenol as directed for aches.  Take Mucinex for congestion.Make sure that you drink at least six 8 ounce glasses of water  each day in order to stay well-hydrated.  Call the number on these discharge instructions today to get a primary care physician.  Your blood sugar today was mildly elevated at 148.  Your blood pressure was also elevated at 147/77. Ask your new primary care doctor to order a TEST KNOWN AS HEMOGLOBIN a1C  to check you for diabetes .Your blood pressure should be rechecked in 3 weeks. Avoid milk while having diarrhea
